# Patient Record
Sex: Female | Born: 1990 | Race: Black or African American | Hispanic: No | Marital: Single | Smoking: Never smoker
Health system: Southern US, Community
[De-identification: ages and names within clinical notes are randomized; demographics above are authoritative.]

## PROBLEM LIST (undated history)

## (undated) DIAGNOSIS — T7840XA Allergy, unspecified, initial encounter: Secondary | ICD-10-CM

## (undated) DIAGNOSIS — F419 Anxiety disorder, unspecified: Secondary | ICD-10-CM

## (undated) DIAGNOSIS — E611 Iron deficiency: Secondary | ICD-10-CM

## (undated) DIAGNOSIS — K219 Gastro-esophageal reflux disease without esophagitis: Secondary | ICD-10-CM

## (undated) DIAGNOSIS — D509 Iron deficiency anemia, unspecified: Secondary | ICD-10-CM

## (undated) DIAGNOSIS — F32A Depression, unspecified: Secondary | ICD-10-CM

## (undated) DIAGNOSIS — F329 Major depressive disorder, single episode, unspecified: Secondary | ICD-10-CM

## (undated) HISTORY — DX: Depression, unspecified: F32.A

## (undated) HISTORY — DX: Allergy, unspecified, initial encounter: T78.40XA

## (undated) HISTORY — DX: Iron deficiency: E61.1

## (undated) HISTORY — DX: Anxiety disorder, unspecified: F41.9

## (undated) HISTORY — DX: Gastro-esophageal reflux disease without esophagitis: K21.9

## (undated) HISTORY — PX: WISDOM TOOTH EXTRACTION: SHX21

---
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
## 2015-11-04 ENCOUNTER — Emergency Department (HOSPITAL_COMMUNITY)
Admission: EM | Admit: 2015-11-04 | Discharge: 2015-11-04 | Disposition: A | Discharge status: Home / Self Care | Payer: Self-pay | Attending: Emergency Medicine | Admitting: Emergency Medicine

## 2015-11-04 ENCOUNTER — Encounter (HOSPITAL_COMMUNITY): Payer: Self-pay | Admitting: Emergency Medicine

## 2015-11-04 DIAGNOSIS — M25531 Pain in right wrist: Secondary | ICD-10-CM | POA: Insufficient documentation

## 2015-11-04 DIAGNOSIS — Y999 Unspecified external cause status: Secondary | ICD-10-CM | POA: Insufficient documentation

## 2015-11-04 DIAGNOSIS — Y9389 Activity, other specified: Secondary | ICD-10-CM | POA: Insufficient documentation

## 2015-11-04 DIAGNOSIS — M25532 Pain in left wrist: Secondary | ICD-10-CM

## 2015-11-04 DIAGNOSIS — T23112A Burn of first degree of left thumb (nail), initial encounter: Secondary | ICD-10-CM | POA: Insufficient documentation

## 2015-11-04 DIAGNOSIS — Y92411 Interstate highway as the place of occurrence of the external cause: Secondary | ICD-10-CM | POA: Insufficient documentation

## 2015-11-04 HISTORY — DX: Iron deficiency anemia, unspecified: D50.9

## 2015-11-04 MED ORDER — IBUPROFEN 400 MG PO TABS: 400.0000 mg | ORAL_TABLET | Freq: Three times a day (TID) | ORAL | 0 refills | Status: DC | PRN
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
## 2016-07-23 ENCOUNTER — Encounter (HOSPITAL_COMMUNITY): Payer: Self-pay | Admitting: *Deleted

## 2016-07-23 DIAGNOSIS — K529 Noninfective gastroenteritis and colitis, unspecified: Secondary | ICD-10-CM | POA: Insufficient documentation

## 2016-07-23 DIAGNOSIS — Z79899 Other long term (current) drug therapy: Secondary | ICD-10-CM | POA: Insufficient documentation

## 2016-07-23 LAB — POC URINE PREG, ED: Preg Test, Ur: NEGATIVE

## 2016-07-23 LAB — URINALYSIS, ROUTINE W REFLEX MICROSCOPIC
Bacteria, UA: NONE SEEN
Bilirubin Urine: NEGATIVE
GLUCOSE, UA: NEGATIVE mg/dL
KETONES UR: NEGATIVE mg/dL
Nitrite: NEGATIVE
Protein, ur: 30 mg/dL — AB
Specific Gravity, Urine: 1.025 (ref 1.005–1.030)
pH: 5 (ref 5.0–8.0)

## 2016-07-23 NOTE — ED Triage Notes (Signed)
Pt complains of generalized abdominal pain and back pain since 9AM today. Pt states the pain feels like she's having hunger pain but has been eating all day. Pt states she felt nauseas and heartburn but has not had emesis today. Pt

## 2016-07-24 ENCOUNTER — Encounter (HOSPITAL_COMMUNITY): Payer: Self-pay | Admitting: Radiology

## 2016-07-24 ENCOUNTER — Emergency Department (HOSPITAL_COMMUNITY)
Admission: EM | Admit: 2016-07-24 | Discharge: 2016-07-24 | Disposition: A | Discharge status: Home / Self Care | Payer: Self-pay | Attending: Emergency Medicine | Admitting: Emergency Medicine

## 2016-07-24 ENCOUNTER — Emergency Department (HOSPITAL_COMMUNITY): Payer: Self-pay

## 2016-07-24 DIAGNOSIS — K529 Noninfective gastroenteritis and colitis, unspecified: Secondary | ICD-10-CM

## 2016-07-24 LAB — COMPREHENSIVE METABOLIC PANEL
ALK PHOS: 83 U/L (ref 38–126)
ALT: 9 U/L — ABNORMAL LOW (ref 14–54)
ANION GAP: 10 (ref 5–15)
AST: 19 U/L (ref 15–41)
Albumin: 4.2 g/dL (ref 3.5–5.0)
BUN: 8 mg/dL (ref 6–20)
CALCIUM: 9 mg/dL (ref 8.9–10.3)
CO2: 21 mmol/L — AB (ref 22–32)
Chloride: 105 mmol/L (ref 101–111)
Creatinine, Ser: 0.74 mg/dL (ref 0.44–1.00)
GFR calc non Af Amer: >60 mL/min (ref >60)
Glucose, Bld: 83 mg/dL (ref 65–99)
Potassium: 3.8 mmol/L (ref 3.5–5.1)
SODIUM: 136 mmol/L (ref 135–145)
Total Bilirubin: 0.4 mg/dL (ref 0.3–1.2)
Total Protein: 7.4 g/dL (ref 6.5–8.1)

## 2016-07-24 LAB — CBC
HCT: 36.5 % (ref 36.0–46.0)
HEMOGLOBIN: 11.7 g/dL — AB (ref 12.0–15.0)
MCH: 28.4 pg (ref 26.0–34.0)
MCHC: 32.1 g/dL (ref 30.0–36.0)
MCV: 88.6 fL (ref 78.0–100.0)
Platelets: 282 10*3/uL (ref 150–400)
RBC: 4.12 MIL/uL (ref 3.87–5.11)
RDW: 12.3 % (ref 11.5–15.5)
WBC: 8 10*3/uL (ref 4.0–10.5)

## 2016-07-24 LAB — LIPASE, BLOOD: Lipase: 25 U/L (ref 11–51)

## 2016-07-24 MED ORDER — IOPAMIDOL (ISOVUE-300) INJECTION 61%
INTRAVENOUS | Status: AC
  Filled 2016-07-24: qty 100

## 2016-07-24 MED ORDER — DICYCLOMINE HCL 10 MG PO CAPS
10.0000 mg | ORAL_CAPSULE | Freq: Once | ORAL | Status: AC
  Administered 2016-07-24: 10 mg via ORAL
  Filled 2016-07-24: qty 1

## 2016-07-24 MED ORDER — DICYCLOMINE HCL 20 MG PO TABS: 20.0000 mg | ORAL_TABLET | Freq: Two times a day (BID) | ORAL | 0 refills | Status: DC

## 2016-07-24 MED ORDER — IOPAMIDOL (ISOVUE-300) INJECTION 61%
100.0000 mL | Freq: Once | INTRAVENOUS | Status: AC | PRN
  Administered 2016-07-24: 100 mL via INTRAVENOUS

## 2016-07-24 MED ORDER — ONDANSETRON 8 MG PO TBDP: 8.0000 mg | ORAL_TABLET | Freq: Three times a day (TID) | ORAL | 0 refills | Status: DC | PRN

## 2016-07-24 MED ORDER — GI COCKTAIL ~~LOC~~
30.0000 mL | Freq: Once | ORAL | Status: AC
  Administered 2016-07-24: 30 mL via ORAL
  Filled 2016-07-24: qty 30

## 2016-07-24 NOTE — ED Provider Notes (Signed)
[facility] DEPT Provider Note   CSN: [id number] Arrival date & time: 07/23/16  2226  By signing my name below, I, Diona Browner, attest that this documentation has been prepared under the direction and in the presence of Lehman Whiteley, PA-C. Electronically Signed: Diona Browner, ED Scribe. 07/24/16. 12:34 AM.  History   Chief Complaint Chief Complaint  Patient presents with  . Abdominal Pain    HPI Victoria Lester is a 26 y.o. female who presents to the Emergency Department complaining of gradually worsening abdominal pain that started ~ 9 am on 07/23/16. Associated sx include right back pain, nausea, CP, HA and heartburn. She reports it feels like she is hunger but she has been eating all day. She reports taking migraine medication for her HA. She also tried sleeping to alleviate her pain, with no relief. No hx of abdominal surgery. Currently on her menstrual period. Pt denies vomiting, fever, dysuria, frequency, urgency.    The history is provided by the patient. No language interpreter was used.    Past Medical History:  Diagnosis Date  . Iron deficiency anemia     There are no active problems to display for this patient.   Past Surgical History:  Procedure Laterality Date  . WISDOM TOOTH EXTRACTION      OB History    No data available       Home Medications    Prior to Admission medications   Medication Sig Start Date End Date Taking? Authorizing Provider  ibuprofen (ADVIL,MOTRIN) 400 MG tablet Take 1 tablet (400 mg total) by mouth every 8 (eight) hours as needed. 11/04/15   Azalia Bilis, MD    Family History No family history on file.  Social History Social History  Substance Use Topics  . Smoking status: Never Smoker  . Smokeless tobacco: Never Used  . Alcohol use Yes     Allergies   Patient has no known allergies.   Review of Systems Review of Systems  Constitutional: Negative for fever.  Cardiovascular: Positive for chest pain.    Gastrointestinal: Positive for abdominal pain and nausea. Negative for vomiting.  Genitourinary: Negative for dysuria, frequency and urgency.  Musculoskeletal: Positive for back pain.  Neurological: Positive for headaches.  All other systems reviewed and are negative.    Physical Exam Updated Vital Signs BP (!) 153/112 (BP Location: Left Arm)   Pulse 81   Temp 98 F (36.7 C) (Oral)   Resp 19   LMP 07/23/2016   SpO2 100%   Physical Exam  Constitutional: She appears well-developed and well-nourished. No distress.  HENT:  Head: Normocephalic and atraumatic.  Eyes: Conjunctivae are normal.  Neck: Normal range of motion. Neck supple.  Cardiovascular: Normal rate, regular rhythm and normal heart sounds.   Pulmonary/Chest: Effort normal and breath sounds normal. No respiratory distress. She has no wheezes. She has no rales.  Abdominal: Soft. Bowel sounds are normal. She exhibits no distension. There is no tenderness. There is no rebound.  Mild periumbilical and epigastric tenderness. No guarding  Musculoskeletal: Normal range of motion. She exhibits no edema.  Neurological: She is alert.  Skin: Skin is warm and dry. No pallor.  Psychiatric: She has a normal mood and affect. Her behavior is normal.  Nursing note and vitals reviewed.    ED Treatments / Results  DIAGNOSTIC STUDIES: Oxygen Saturation is 98% on RA, normal by my interpretation.   COORDINATION OF CARE: 12:34 AM-Discussed next steps with pt. Pt verbalized understanding and is agreeable with the plan.  Labs (all labs ordered are listed, but only abnormal results are displayed) Labs Reviewed  COMPREHENSIVE METABOLIC PANEL - Abnormal; Notable for the following:       Result Value   CO2 21 (*)    ALT 9 (*)    All other components within normal limits  CBC - Abnormal; Notable for the following:    Hemoglobin 11.7 (*)    All other components within normal limits  URINALYSIS, ROUTINE W REFLEX MICROSCOPIC -  Abnormal; Notable for the following:    APPearance HAZY (*)    Hgb urine dipstick LARGE (*)    Protein, ur 30 (*)    Leukocytes, UA TRACE (*)    Squamous Epithelial / LPF 0-5 (*)    All other components within normal limits  LIPASE, BLOOD  POC URINE PREG, ED    EKG  EKG Interpretation None       Radiology Ct Abdomen Pelvis W Contrast  Result Date: 07/24/2016 CLINICAL DATA:  Generalized abdominal back pain since 9 a.m. with nausea EXAM: CT ABDOMEN AND PELVIS WITH CONTRAST TECHNIQUE: Multidetector CT imaging of the abdomen and pelvis was performed using the standard protocol following bolus administration of intravenous contrast. CONTRAST:  100mL ISOVUE-300 IOPAMIDOL (ISOVUE-300) INJECTION 61% COMPARISON:  None. FINDINGS: Lower chest: No acute abnormality. Hepatobiliary: Well-circumscribed 9 mm hypodensity in the periphery of the right hepatic lobe may represent a cyst or hemangioma but is too small to further characterize. No biliary dilatation is identified. The gallbladder is contracted without stones. Pancreas: Normal without ductal dilatation, mass or inflammation Spleen: Normal sized without focal mass Adrenals/Urinary Tract: Adrenal glands are unremarkable. Kidneys are normal, without renal calculi, focal lesion, or hydronephrosis. Bladder is unremarkable. Stomach/Bowel: Contracted stomach. Normal small bowel rotation. There is mild fluid-filled distention with mucosal enhancement of jejunal and ileal loops consistent with an enteritis. Large bowel is unremarkable. The appendix is normal. Vascular/Lymphatic: Normal without adenopathy. Reproductive: Intrauterine device is noted in the lower uterine segment with the uterus tilted to the right. No perforation of the IUD is seen. Enhancing left ovarian cyst may represent the corpus luteum. Other: No pneumoperitoneum nor free fluid. Musculoskeletal: No acute or significant osseous findings. IMPRESSION: 1. Diffuse mild fluid-filled small bowel  distention with mucosal enhancement consistent with a small bowel enteritis. No mechanical obstruction identified. 2. An intrauterine device is seen in the lower uterine segment. If the patient has pain in the lower pelvis, consider malpositioned IUD as a potential source for pain. No perforation however is noted. There is also an adjacent enhancing left ovarian cyst likely the corpus luteum or potentially a hemorrhagic cyst. Electronically Signed   By: Tollie Ethavid  Kwon M.D.   On: 07/24/2016 03:04    Procedures Procedures (including critical care time)  Medications Ordered in ED Medications  gi cocktail (Maalox,Lidocaine,Donnatal) (not administered)     Initial Impression / Assessment and Plan / ED Course  I have reviewed the triage vital signs and the nursing notes.  Pertinent labs & imaging results that were available during my care of the patient were reviewed by me and considered in my medical decision making (see chart for details).    Patient in emergency department with pain umbilical abdominal pain that radiates to the right flank. No associated vomiting. She has had some nausea today. No pain in the right upper quadrant on exam. Mild epigastric tenderness. No peritoneal signs. Question gastritis versus acid reflux. We'll get labs. Will try GI cocktail.  1:57 AM Center assessed, states that  she had no relief of pain with GI cocktail, patient is tearful. States pain is 10 out of 10. Discussed further evaluation with CT scan. Patient also stated that she has had kidney stones in the past but this does not feel like prior kidney stone. We will do CT with contrast.  3:17 AM Patient CT scan showing enteritis. It also showed possible malpositioned IUD and possible adnexal cyst, however patient is not having much pain in lower abdomen most of her pain is around her umbilicus and upper abdomen. I will treat her with Bentyl for possible intestinal spasms. Advised brat diet, fluids. Will give  prescription for Zofran for nausea. Will have patient follow-up with family doctor but improving in 1-2 days. Return precautions discussed. Patient is tolerating oral fluids, she is in no acute distress at this time, vital signs are normal, she is stable for discharge.  Vitals:   07/23/16 2237 07/24/16 0030 07/24/16 0121 07/24/16 0241  BP: (!) 147/98 (!) 153/112  125/89  Pulse: 100 81  69  Resp: 18 19  13   Temp: 98 F (36.7 C)     TempSrc: Oral     SpO2: 98% 100% 100% 100%     Final Clinical Impressions(s) / ED Diagnoses   Final diagnoses:  Enteritis    New Prescriptions New Prescriptions   DICYCLOMINE (BENTYL) 20 MG TABLET    Take 1 tablet (20 mg total) by mouth 2 (two) times daily.   ONDANSETRON (ZOFRAN ODT) 8 MG DISINTEGRATING TABLET    Take 1 tablet (8 mg total) by mouth every 8 (eight) hours as needed for nausea or vomiting.   I personally performed the services described in this documentation, which was scribed in my presence. The recorded information has been reviewed and is accurate.     Jaynie Crumble, PA-C 07/24/16 Lajoyce Corners, MD 07/24/16 [phone]

## 2016-07-24 NOTE — Discharge Instructions (Signed)
Drink plenty of fluids. Take Zofran as prescribed as needed for nausea. Take Bentyl for intestinal spasms. Follow-up with family doctor if not improving in 1-2 days. Return if worsening symptoms.

## 2018-01-10 IMAGING — CT CT ABD-PELV W/ CM
2 of 4 series · 15 of 46 positions shown, 17 images · IV contrast (iopamidol)
Comparison: None.

CLINICAL DATA: Generalized abdominal back pain since 9 a.m. with
nausea

EXAM:
CT ABDOMEN AND PELVIS WITH CONTRAST
TECHNIQUE: Multidetector CT imaging of the abdomen and pelvis was performed
using the standard protocol following bolus administration of
intravenous contrast.
CONTRAST:  100mL [id number] IOPAMIDOL ([id number]) INJECTION 61%

[Series 2: abd/pel with · axial · 0.70mm/px · z∈[-467,-97]mm · 12 of 84 slices shown, 14 images]
[im 5/84  soft-tissue]
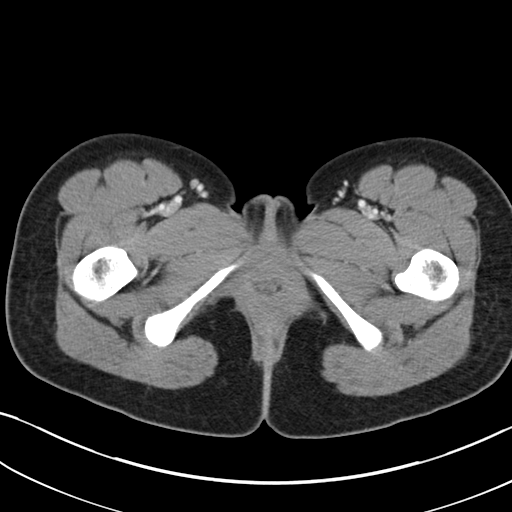
[im 5/84  bone]
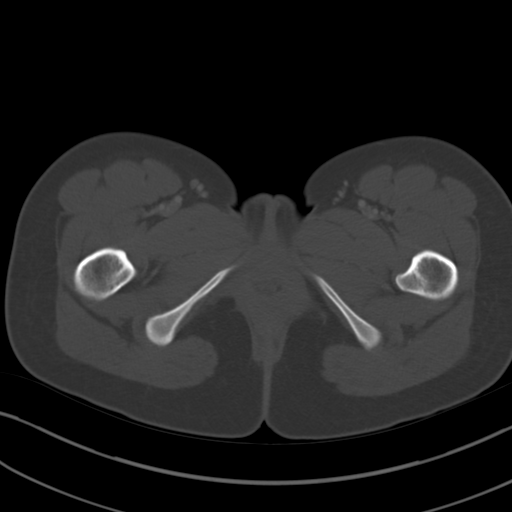
[im 14/84  soft-tissue]
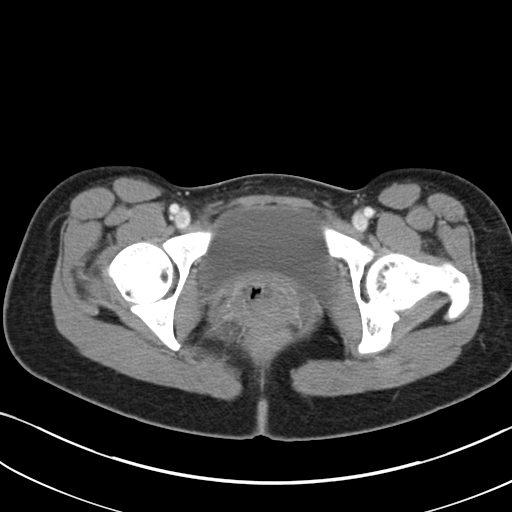
[im 18/84  soft-tissue]
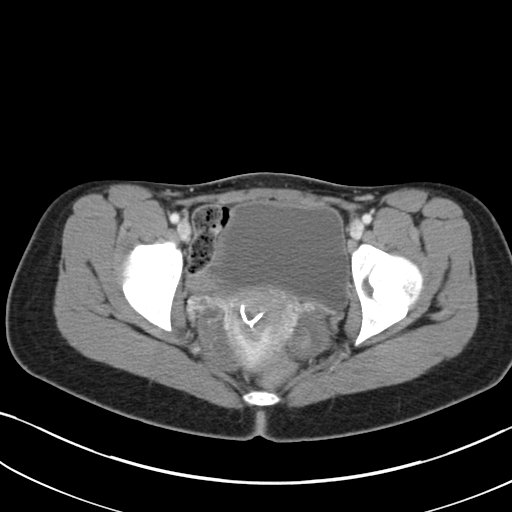
[im 27/84  soft-tissue]
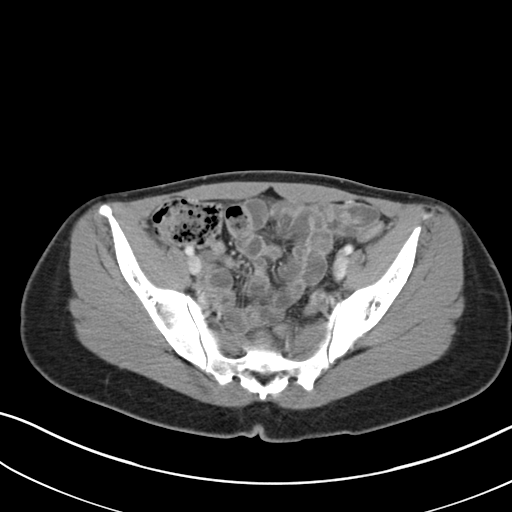
[im 31/84  soft-tissue]
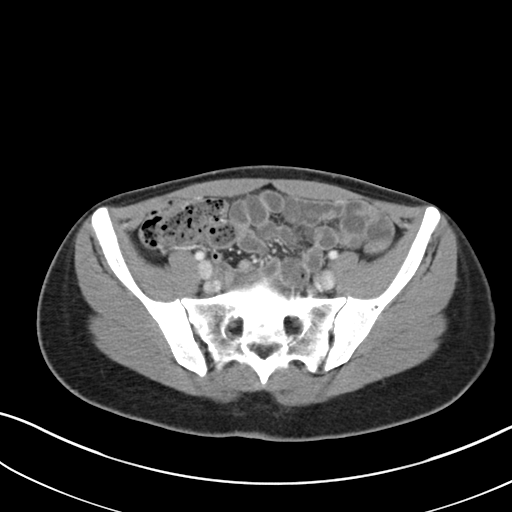
[im 40/84  soft-tissue]
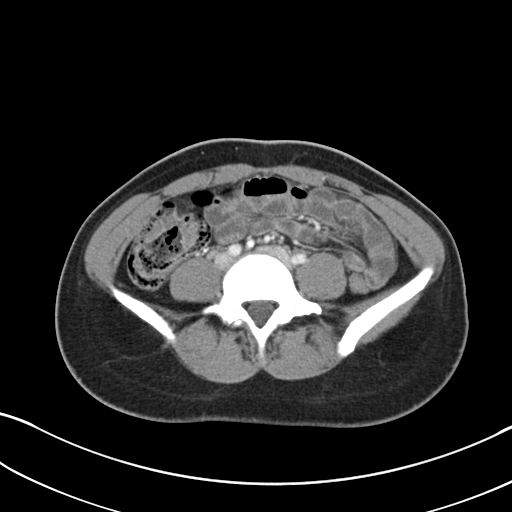
[im 44/84  soft-tissue]
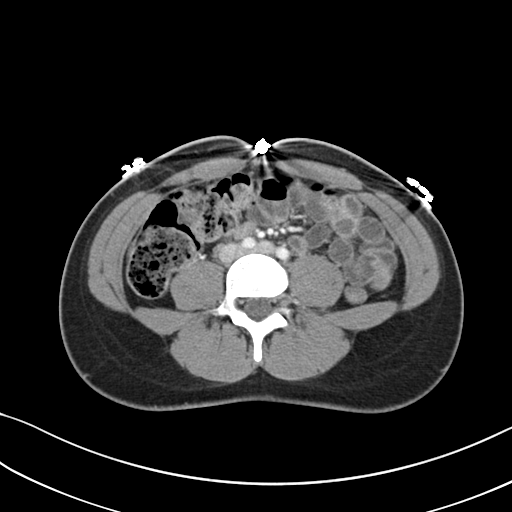
[im 53/84  soft-tissue]
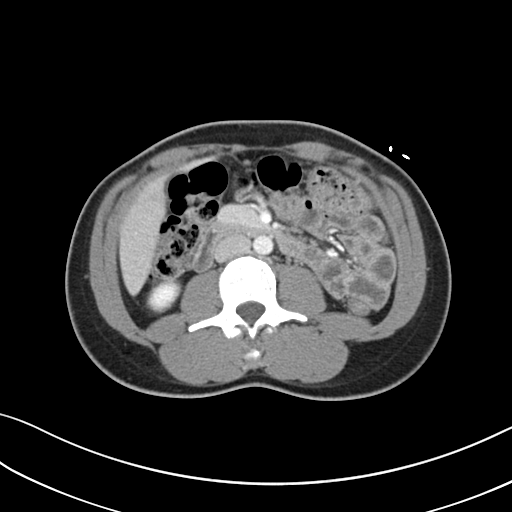
[im 57/84  soft-tissue]
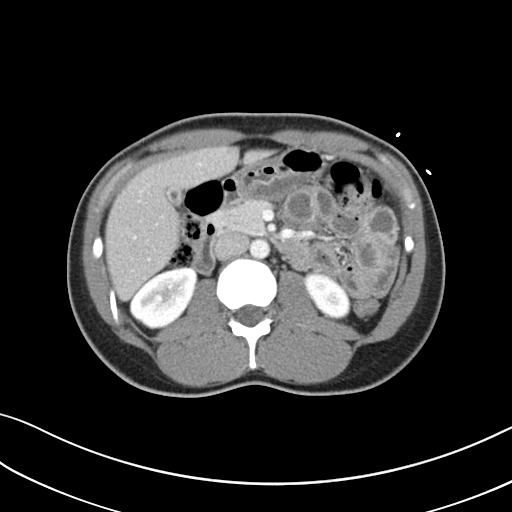
[im 57/84  bone]
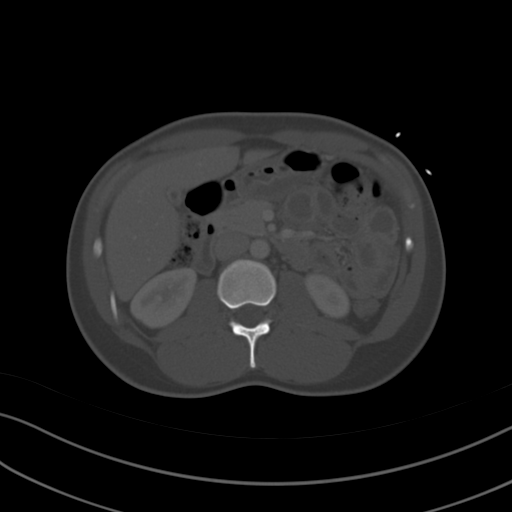
[im 66/84  soft-tissue]
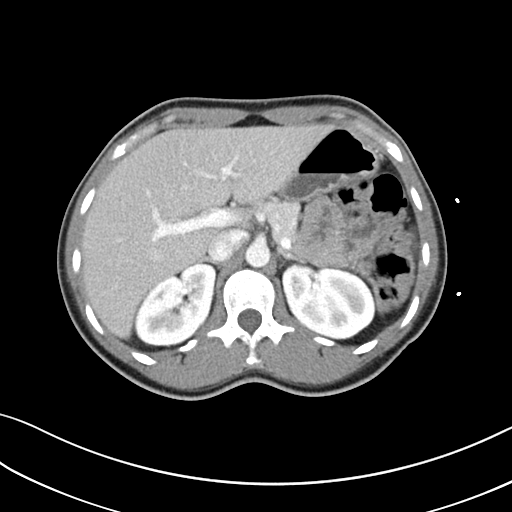
[im 70/84  soft-tissue]
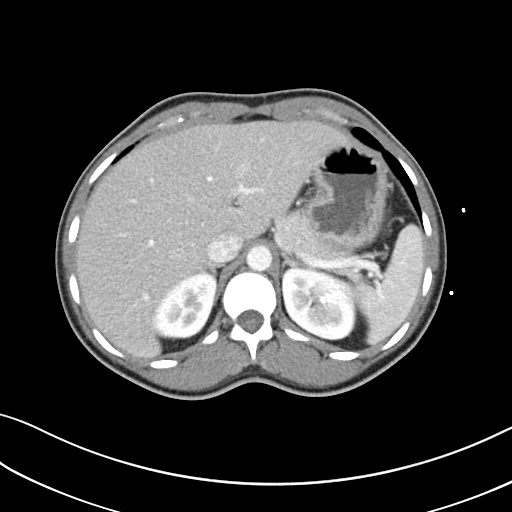
[im 79/84  soft-tissue]
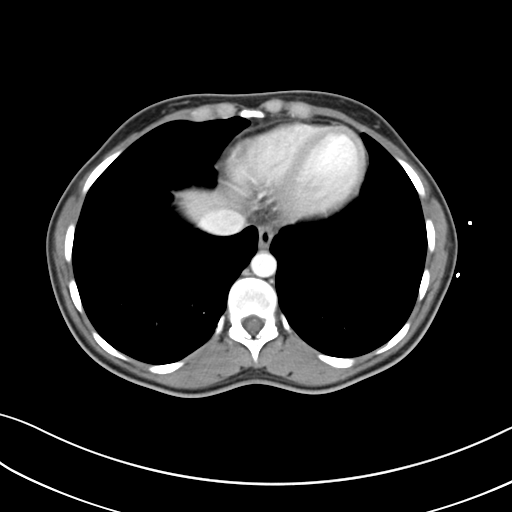

[Series 5: coronal a/|p · coronal · 0.63mm/px · 3 of 122 slices shown]
[im 41/122  soft-tissue]
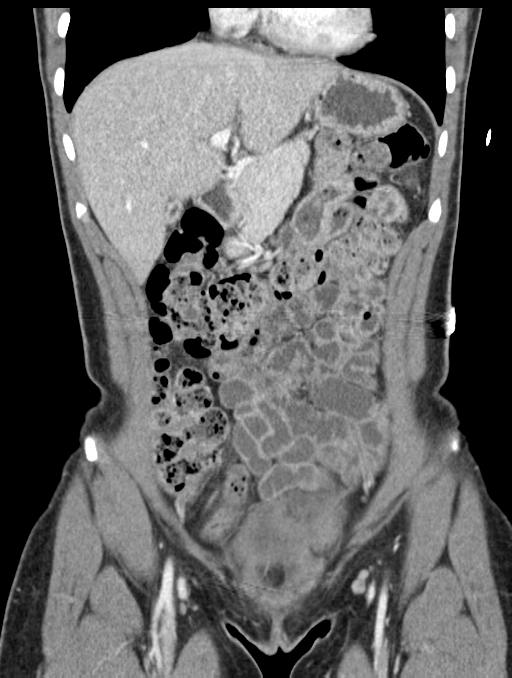
[im 54/122  soft-tissue]
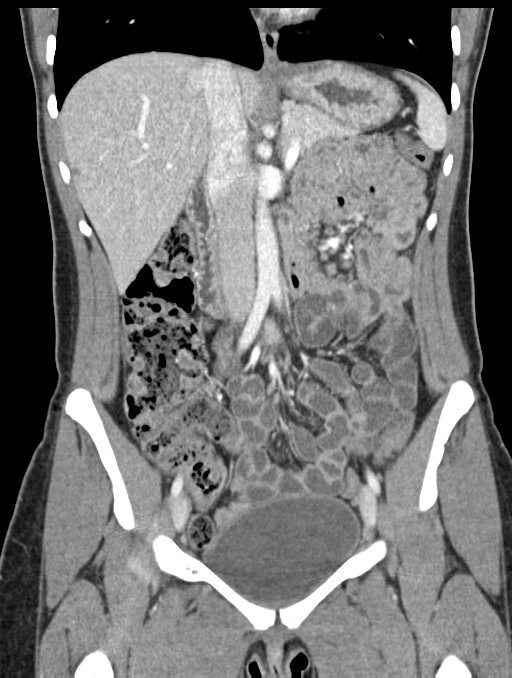
[im 68/122  soft-tissue]
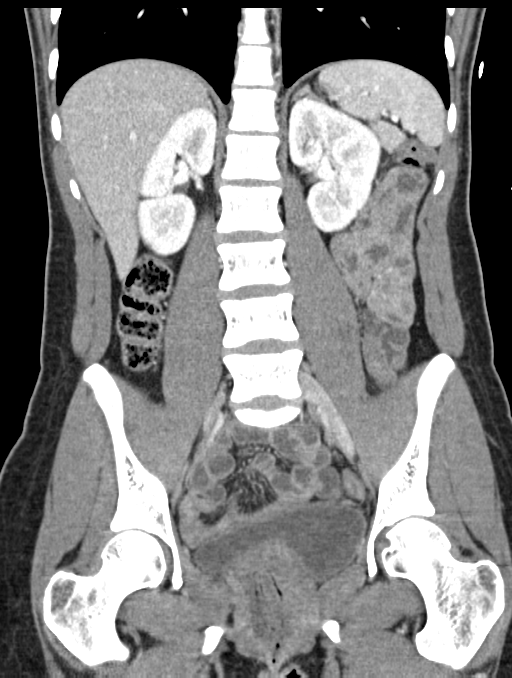

[15 of 46 positions shown; findings below may reference images not displayed]

FINDINGS: Lower chest: No acute abnormality.

Hepatobiliary: Well-circumscribed 9 mm hypodensity in the periphery
of the right hepatic lobe may represent a cyst or hemangioma but is
too small to further characterize. No biliary dilatation is
identified. The gallbladder is contracted without stones.

Pancreas: Normal without ductal dilatation, mass or inflammation

Spleen: Normal sized without focal mass

Adrenals/Urinary Tract: Adrenal glands are unremarkable. Kidneys are
normal, without renal calculi, focal lesion, or hydronephrosis.
Bladder is unremarkable.

Stomach/Bowel: Contracted stomach. Normal small bowel rotation.
There is mild fluid-filled distention with mucosal enhancement of
jejunal and ileal loops consistent with an enteritis. Large bowel is
unremarkable. The appendix is normal.

Vascular/Lymphatic: Normal without adenopathy.

Reproductive: Intrauterine device is noted in the lower uterine
segment with the uterus tilted to the right. No perforation of the
IUD is seen. Enhancing left ovarian cyst may represent the corpus
luteum.

Other: No pneumoperitoneum nor free fluid.

Musculoskeletal: No acute or significant osseous findings.
IMPRESSION: 1. Diffuse mild fluid-filled small bowel distention with mucosal
enhancement consistent with a small bowel enteritis. No mechanical
obstruction identified.
2. An intrauterine device is seen in the lower uterine segment. If
the patient has pain in the lower pelvis, consider malpositioned IUD
as a potential source for pain. No perforation however is noted.
There is also an adjacent enhancing left ovarian cyst likely the
corpus luteum or potentially a hemorrhagic cyst.

## 2019-04-10 ENCOUNTER — Other Ambulatory Visit: Payer: Self-pay

## 2019-04-10 ENCOUNTER — Telehealth (INDEPENDENT_AMBULATORY_CARE_PROVIDER_SITE_OTHER): Payer: Medicaid Other | Admitting: Family Medicine

## 2019-04-10 DIAGNOSIS — K30 Functional dyspepsia: Secondary | ICD-10-CM

## 2019-04-10 DIAGNOSIS — R0982 Postnasal drip: Secondary | ICD-10-CM

## 2019-04-10 MED ORDER — OMEPRAZOLE 20 MG PO CPDR: 20.0000 mg | DELAYED_RELEASE_CAPSULE | Freq: Every day | ORAL | 3 refills | Status: DC

## 2019-04-10 MED ORDER — FLUTICASONE PROPIONATE 50 MCG/ACT NA SUSP: 2.0000 | Freq: Every day | NASAL | 6 refills | Status: AC

## 2019-04-10 NOTE — Patient Instructions (Signed)
° ° ° °  If you have lab work done today you will be contacted with your lab results within the next 2 weeks.  If you have not heard from us then please contact us. The fastest way to get your results is to register for My Chart. ° ° °IF you received an x-ray today, you will receive an invoice from [facility]. Please contact [facility] at [phone] with questions or concerns regarding your invoice.  ° °IF you received labwork today, you will receive an invoice from [facility]. Please contact [facility] at [phone] with questions or concerns regarding your invoice.  ° °Our billing staff will not be able to assist you with questions regarding bills from these companies. ° °You will be contacted with the lab results as soon as they are available. The fastest way to get your results is to activate your My Chart account. Instructions are located on the last page of this paperwork. If you have not heard from us regarding the results in 2 weeks, please contact this office. °  ° ° ° °

## 2019-04-10 NOTE — Progress Notes (Signed)
Video Encounter- SOAP NOTE Established Patient  This video encounter was conducted with the patient's (or proxy's) verbal consent via audio telecommunications: yes/no: Yes Patient was instructed to have this encounter in a suitably private space; and to only have persons present to whom they give permission to participate. In addition, patient identity was confirmed by use of name plus two identifiers (DOB and address).  I discussed the limitations, risks, security and privacy concerns of performing an evaluation and management service by telephone and the availability of in person appointments. I also discussed with the patient that there may be a patient responsible charge related to this service. The patient expressed understanding and agreed to proceed.  I spent a total of TIME; 0 MIN TO 60 MIN: 15 minutes talking with the patient or their proxy.  Chief Complaint  Patient presents with  . pressure in ears,st    CC: pt c/o burning sensation in chest, pressure and aching in ears x 5 days.  Pt denies sob, loss of sense of taste and smell, fever, nausea/vomiting and abdominal pain or diarrhea.  No recent weight or bp taken.  No travel outside of [place] or [place] in the last 3 weeks.    Subjective   Victoria Lester is a 29 y.o. established patient. Telephone visit today for  HPI  Patient reports a week ago she had burning in her chest and thought it was heartburn It goes through her chest  She states that when she inhales it hurts even more She states that she feels like  She took tums and it help She burped a little bit She states that she stopped smoking 3 days ago No vaping or hookah She smokes marijuana   She is taking sudafed and zyrtec She states that she gets drainage postnasal No cough  No fevers or chills   There are no problems to display for this patient.   Past Medical History:  Diagnosis Date  . Iron deficiency anemia     Current Outpatient Medications  Medication  Sig Dispense Refill  . ibuprofen (ADVIL,MOTRIN) 400 MG tablet Take 1 tablet (400 mg total) by mouth every 8 (eight) hours as needed. 15 tablet 0  . levonorgestrel (MIRENA) 20 MCG/24HR IUD 1 each by Intrauterine route once.    Marland Kitchen aspirin-acetaminophen-caffeine (EXCEDRIN MIGRAINE) 250-250-65 MG tablet Take 2 tablets by mouth every 6 (six) hours as needed for headache or migraine.    . dicyclomine (BENTYL) 20 MG tablet Take 1 tablet (20 mg total) by mouth 2 (two) times daily. (Patient not taking: Reported on 04/10/2019) 20 tablet 0  . fluticasone (FLONASE) 50 MCG/ACT nasal spray Place 2 sprays into both nostrils daily. 16 g 6  . loratadine (CLARITIN) 10 MG tablet Take 10 mg by mouth daily.    Marland Kitchen omeprazole (PRILOSEC) 20 MG capsule Take 1 capsule (20 mg total) by mouth daily. 30 capsule 3  . ondansetron (ZOFRAN ODT) 8 MG disintegrating tablet Take 1 tablet (8 mg total) by mouth every 8 (eight) hours as needed for nausea or vomiting. (Patient not taking: Reported on 04/10/2019) 10 tablet 0   No current facility-administered medications for this visit.    No Known Allergies  Social History   Socioeconomic History  . Marital status: Single    Spouse name: Not on file  . Number of children: Not on file  . Years of education: Not on file  . Highest education level: Not on file  Occupational History  . Not on  file  Tobacco Use  . Smoking status: Never Smoker  . Smokeless tobacco: Never Used  Substance and Sexual Activity  . Alcohol use: Yes  . Drug use: Not on file  . Sexual activity: Not on file  Other Topics Concern  . Not on file  Social History Narrative  . Not on file   Social Determinants of Health   Financial Resource Strain:   . Difficulty of Paying Living Expenses: Not on file  Food Insecurity:   . Worried About Programme researcher, broadcasting/film/video in the Last Year: Not on file  . Ran Out of Food in the Last Year: Not on file  Transportation Needs:   . Lack of Transportation (Medical): Not on  file  . Lack of Transportation (Non-Medical): Not on file  Physical Activity:   . Days of Exercise per Week: Not on file  . Minutes of Exercise per Session: Not on file  Stress:   . Feeling of Stress : Not on file  Social Connections:   . Frequency of Communication with Friends and Family: Not on file  . Frequency of Social Gatherings with Friends and Family: Not on file  . Attends Religious Services: Not on file  . Active Member of Clubs or Organizations: Not on file  . Attends Banker Meetings: Not on file  . Marital Status: Not on file  Intimate Partner Violence:   . Fear of Current or Ex-Partner: Not on file  . Emotionally Abused: Not on file  . Physically Abused: Not on file  . Sexually Abused: Not on file    ROS Review of Systems  Constitutional: Negative for activity change, appetite change, chills and fever.  HENT: Negative for congestion, nosebleeds, trouble swallowing and voice change.   Respiratory: Negative for cough, shortness of breath and wheezing.   Gastrointestinal: Negative for diarrhea, nausea and vomiting.  Genitourinary: Negative for difficulty urinating, dysuria, flank pain and hematuria.  Musculoskeletal: Negative for back pain, joint swelling and neck pain.  Neurological: Negative for dizziness, speech difficulty, light-headedness and numbness.  See HPI. All other review of systems negative.   Objective   Vitals as reported by the patient:  Physical Exam  Constitutional: Oriented to person, place, and time. Appears well-developed and well-nourished.  HENT:  Head: Normocephalic and atraumatic.  Eyes: Conjunctivae and EOM are normal.  Pulmonary/Chest: Effort normal  Neurological: Is alert and oriented to person, place, and time.  Skin: Skin is warm. Capillary refill takes less than 2 seconds.  Psychiatric: Has a normal mood and affect. Behavior is normal. Judgment and thought content normal.   There were no vitals filed for this visit.   [name] was seen today for pressure in ears,st.  Diagnoses and all orders for this visit:  Burning sensation of stomach  Postnasal drip  Other orders -     omeprazole (PRILOSEC) 20 MG capsule; Take 1 capsule (20 mg total) by mouth daily. -     fluticasone (FLONASE) 50 MCG/ACT nasal spray; Place 2 sprays into both nostrils daily.  advised pt to take flonase for postnasal drip and xyzal   Advised PPI omeprazole for burning sensation and also smoking cessation  Follow up in 4 weeks for testing for H. Pylori if symptoms do not improve    I discussed the assessment and treatment plan with the patient. The patient was provided an opportunity to ask questions and all were answered. The patient agreed with the plan and demonstrated an understanding of the instructions.  The patient was advised to call back or seek an in-person evaluation if the symptoms worsen or if the condition fails to improve as anticipated.  I provided 15 minutes of non-face-to-face time during this encounter.  Forrest Moron, MD  Primary Care at [facility]

## 2019-04-10 NOTE — Progress Notes (Signed)
CC: pt c/o burning sensation in chest, pressure and aching in ears x 5 days.  Pt denies sob, loss of sense of taste and smell, fever, nausea/vomiting and abdominal pain or diarrhea.  No recent weight or bp taken.  No travel outside of [place] or [place] in the last 3 weeks.

## 2019-05-08 ENCOUNTER — Ambulatory Visit: Payer: Medicaid Other | Admitting: Family Medicine

## 2019-05-13 ENCOUNTER — Ambulatory Visit (INDEPENDENT_AMBULATORY_CARE_PROVIDER_SITE_OTHER): Payer: Self-pay | Admitting: Family Medicine

## 2019-05-13 ENCOUNTER — Encounter: Payer: Self-pay | Admitting: Family Medicine

## 2019-05-13 ENCOUNTER — Other Ambulatory Visit: Payer: Self-pay

## 2019-05-13 VITALS — BP 130/76 | HR 82 | Temp 97.8°F | Resp 14 | Ht 64.0 in | Wt 137.8 lb

## 2019-05-13 DIAGNOSIS — R0789 Other chest pain: Secondary | ICD-10-CM

## 2019-05-13 DIAGNOSIS — K30 Functional dyspepsia: Secondary | ICD-10-CM

## 2019-05-13 NOTE — Progress Notes (Signed)
Established Patient Office Visit  Subjective:  Patient ID: Victoria Lester, female    DOB: 04-28-1990  Age: 29 y.o. MRN: [id number]  CC:  Chief Complaint  Patient presents with  . Gastroesophageal Reflux    pt states its tollreable but the medication does not stop it completly states it feels like a burning spreadnig across her chest     HPI Victoria Lester presents for follow up on burning chest pain  She states that she was having this burning chest discomfort On the visit on 04/10/19 she was prescribed omeprazole which she takes daily and also she quit smoking. She quit smoking She sometimes only eats one meal a day She does not drink energy drinks or caffeine She drinks wine on the weekends  Past Medical History:  Diagnosis Date  . Iron deficiency anemia     Past Surgical History:  Procedure Laterality Date  . WISDOM TOOTH EXTRACTION      No family history on file.  Social History   Socioeconomic History  . Marital status: Single    Spouse name: Not on file  . Number of children: Not on file  . Years of education: Not on file  . Highest education level: Not on file  Occupational History  . Not on file  Tobacco Use  . Smoking status: Never Smoker  . Smokeless tobacco: Never Used  Substance and Sexual Activity  . Alcohol use: Yes  . Drug use: Not on file  . Sexual activity: Not on file  Other Topics Concern  . Not on file  Social History Narrative  . Not on file   Social Determinants of Health   Financial Resource Strain:   . Difficulty of Paying Living Expenses: Not on file  Food Insecurity:   . Worried About Charity fundraiser in the Last Year: Not on file  . Ran Out of Food in the Last Year: Not on file  Transportation Needs:   . Lack of Transportation (Medical): Not on file  . Lack of Transportation (Non-Medical): Not on file  Physical Activity:   . Days of Exercise per Week: Not on file  . Minutes of Exercise per Session: Not on file  Stress:     . Feeling of Stress : Not on file  Social Connections:   . Frequency of Communication with Friends and Family: Not on file  . Frequency of Social Gatherings with Friends and Family: Not on file  . Attends Religious Services: Not on file  . Active Member of Clubs or Organizations: Not on file  . Attends Archivist Meetings: Not on file  . Marital Status: Not on file  Intimate Partner Violence:   . Fear of Current or Ex-Partner: Not on file  . Emotionally Abused: Not on file  . Physically Abused: Not on file  . Sexually Abused: Not on file    Outpatient Medications Prior to Visit  Medication Sig Dispense Refill  . aspirin-acetaminophen-caffeine (EXCEDRIN MIGRAINE) 250-250-65 MG tablet Take 2 tablets by mouth every 6 (six) hours as needed for headache or migraine.    . dicyclomine (BENTYL) 20 MG tablet Take 1 tablet (20 mg total) by mouth 2 (two) times daily. 20 tablet 0  . fluticasone (FLONASE) 50 MCG/ACT nasal spray Place 2 sprays into both nostrils daily. 16 g 6  . ibuprofen (ADVIL,MOTRIN) 400 MG tablet Take 1 tablet (400 mg total) by mouth every 8 (eight) hours as needed. 15 tablet 0  . levonorgestrel (MIRENA) 20  MCG/24HR IUD 1 each by Intrauterine route once.    Marland Kitchen omeprazole (PRILOSEC) 20 MG capsule Take 1 capsule (20 mg total) by mouth daily. 30 capsule 3  . ondansetron (ZOFRAN ODT) 8 MG disintegrating tablet Take 1 tablet (8 mg total) by mouth every 8 (eight) hours as needed for nausea or vomiting. 10 tablet 0  . sertraline (ZOLOFT) 50 MG tablet Take 50 mg by mouth daily.    Marland Kitchen loratadine (CLARITIN) 10 MG tablet Take 10 mg by mouth daily.     No facility-administered medications prior to visit.    No Known Allergies  ROS Review of Systems Review of Systems  Constitutional: Negative for activity change, appetite change, chills and fever.  HENT: Negative for congestion, nosebleeds, trouble swallowing and voice change.   Respiratory: Negative for cough, shortness of  breath and wheezing.   Gastrointestinal: Negative for diarrhea, nausea and vomiting.  Genitourinary: Negative for difficulty urinating, dysuria, flank pain and hematuria.  Musculoskeletal: Negative for back pain, joint swelling and neck pain.  Neurological: Negative for dizziness, speech difficulty, light-headedness and numbness.  See HPI. All other review of systems negative.     Objective:    Physical Exam  BP 130/76   Pulse 82   Temp 97.8 F (36.6 C) (Temporal)   Resp 14   Ht 5\' 4"  (1.626 m)   Wt 137 lb 12.8 oz (62.5 kg)   SpO2 94%   BMI 23.65 kg/m  Wt Readings from Last 3 Encounters:  05/13/19 137 lb 12.8 oz (62.5 kg)  11/04/15 140 lb (63.5 kg)   Physical Exam  Constitutional: Oriented to person, place, and time. Appears well-developed and well-nourished.  HENT:  Head: Normocephalic and atraumatic.  Eyes: Conjunctivae and EOM are normal.  Cardiovascular: Normal rate, regular rhythm, normal heart sounds and intact distal pulses.  No murmur heard. Pulmonary/Chest: Effort normal and breath sounds normal. No stridor. No respiratory distress. Has no wheezes.  Abdomen: nondistended, normoactive bs, soft, nontender Neurological: Is alert and oriented to person, place, and time.  Skin: Skin is warm. Capillary refill takes less than 2 seconds.  Psychiatric: Has a normal mood and affect. Behavior is normal. Judgment and thought content normal.    Health Maintenance Due  Topic Date Due  . HIV Screening  02/07/2006    There are no preventive care reminders to display for this patient.  No results found for: TSH Lab Results  Component Value Date   WBC 8.0 07/23/2016   HGB 11.7 (L) 07/23/2016   HCT 36.5 07/23/2016   MCV 88.6 07/23/2016   PLT 282 07/23/2016   Lab Results  Component Value Date   NA 136 07/23/2016   K 3.8 07/23/2016   CO2 21 (L) 07/23/2016   GLUCOSE 83 07/23/2016   BUN 8 07/23/2016   CREATININE 0.74 07/23/2016   BILITOT 0.4 07/23/2016   ALKPHOS 83  07/23/2016   AST 19 07/23/2016   ALT 9 (L) 07/23/2016   PROT 7.4 07/23/2016   ALBUMIN 4.2 07/23/2016   CALCIUM 9.0 07/23/2016   ANIONGAP 10 07/23/2016   No results found for: CHOL No results found for: HDL No results found for: LDLCALC No results found for: TRIG No results found for: CHOLHDL No results found for: 07/25/2016    Assessment & Plan:   Problem List Items Addressed This Visit    None    Visit Diagnoses    Other chest pain    -  Primary   Relevant Orders   EKG  12-Lead (Completed)   Ambulatory referral to Gastroenterology   Burning sensation of stomach       Relevant Orders   Ambulatory referral to Gastroenterology    advised pt to eat small meals, chewing food to a pulp Advised to continue PPI Avoid food triggers See GI for evaluation Would like to start empiric carafate but because of her smoking history would prefer EGD first ECG normal sinus rhythm, no arrhythmia    No orders of the defined types were placed in this encounter.   Follow-up: No follow-ups on file.    Doristine Bosworth, MD

## 2019-05-13 NOTE — Patient Instructions (Addendum)
If you have lab work done today you will be contacted with your lab results within the next 2 weeks.  If you have not heard from [place] then please contact us. The fastest way to get your results is to register for My Chart.   IF you received an x-ray today, you will receive an invoice from [facility]. Please contact [facility] at [phone] with questions or concerns regarding your invoice.   IF you received labwork today, you will receive an invoice from [facility]. Please contact [facility] at [phone] with questions or concerns regarding your invoice.   Our billing staff will not be able to assist you with questions regarding bills from these companies.  You will be contacted with the lab results as soon as they are available. The fastest way to get your results is to activate your My Chart account. Instructions are located on the last page of this paperwork. If you have not heard from [place] regarding the results in 2 weeks, please contact this office.     Gastroesophageal Reflux Disease, Adult Gastroesophageal reflux (GER) happens when acid from the stomach flows up into the tube that connects the mouth and the stomach (esophagus). Normally, food travels down the esophagus and stays in the stomach to be digested. However, when a person has GER, food and stomach acid sometimes move back up into the esophagus. If this becomes a more serious problem, the person may be diagnosed with a disease called gastroesophageal reflux disease (GERD). GERD occurs when the reflux:  Happens often.  Causes frequent or severe symptoms.  Causes problems such as damage to the esophagus. When stomach acid comes in contact with the esophagus, the acid may cause soreness (inflammation) in the esophagus. Over time, GERD may create small holes (ulcers) in the lining of the esophagus. What are the causes? This condition is caused by a problem with the muscle between the esophagus and the  stomach (lower esophageal sphincter, or LES). Normally, the LES muscle closes after food passes through the esophagus to the stomach. When the LES is weakened or abnormal, it does not close properly, and that allows food and stomach acid to go back up into the esophagus. The LES can be weakened by certain dietary substances, medicines, and medical conditions, including:  Tobacco use.  Pregnancy.  Having a hiatal hernia.  Alcohol use.  Certain foods and beverages, such as coffee, chocolate, onions, and peppermint. What increases the risk? You are more likely to develop this condition if you:  Have an increased body weight.  Have a connective tissue disorder.  Use NSAID medicines. What are the signs or symptoms? Symptoms of this condition include:  Heartburn.  Difficult or painful swallowing.  The feeling of having a lump in the throat.  Abitter taste in the mouth.  Bad breath.  Having a large amount of saliva.  Having an upset or bloated stomach.  Belching.  Chest pain. Different conditions can cause chest pain. Make sure you see your health care provider if you experience chest pain.  Shortness of breath or wheezing.  Ongoing (chronic) cough or a night-time cough.  Wearing away of tooth enamel.  Weight loss. How is this diagnosed? Your health care provider will take a medical history and perform a physical exam. To determine if you have mild or severe GERD, your health care provider may also monitor how you respond to treatment. You may also have tests, including:  A test to examine your stomach and  esophagus with a small camera (endoscopy).  A test thatmeasures the acidity level in your esophagus.  A test thatmeasures how much pressure is on your esophagus.  A barium swallow or modified barium swallow test to show the shape, size, and functioning of your esophagus. How is this treated? The goal of treatment is to help relieve your symptoms and to prevent  complications. Treatment for this condition may vary depending on how severe your symptoms are. Your health care provider may recommend:  Changes to your diet.  Medicine.  Surgery. Follow these instructions at home: Eating and drinking   Follow a diet as recommended by your health care provider. This may involve avoiding foods and drinks such as: ? Coffee and tea (with or without caffeine). ? Drinks that containalcohol. ? Energy drinks and sports drinks. ? Carbonated drinks or sodas. ? Chocolate and cocoa. ? Peppermint and mint flavorings. ? Garlic and onions. ? Horseradish. ? Spicy and acidic foods, including peppers, chili powder, curry powder, vinegar, hot sauces, and barbecue sauce. ? Citrus fruit juices and citrus fruits, such as oranges, lemons, and limes. ? Tomato-based foods, such as red sauce, chili, salsa, and pizza with red sauce. ? Fried and fatty foods, such as donuts, french fries, potato chips, and high-fat dressings. ? High-fat meats, such as hot dogs and fatty cuts of red and white meats, such as rib eye steak, sausage, ham, and bacon. ? High-fat dairy items, such as whole milk, butter, and cream cheese.  Eat small, frequent meals instead of large meals.  Avoid drinking large amounts of liquid with your meals.  Avoid eating meals during the 2-3 hours before bedtime.  Avoid lying down right after you eat.  Do not exercise right after you eat. Lifestyle   Do not use any products that contain nicotine or tobacco, such as cigarettes, e-cigarettes, and chewing tobacco. If you need help quitting, ask your health care provider.  Try to reduce your stress by using methods such as yoga or meditation. If you need help reducing stress, ask your health care provider.  If you are overweight, reduce your weight to an amount that is healthy for you. Ask your health care provider for guidance about a safe weight loss goal. General instructions  Pay attention to any  changes in your symptoms.  Take over-the-counter and prescription medicines only as told by your health care provider. Do not take aspirin, ibuprofen, or other NSAIDs unless your health care provider told you to do so.  Wear loose-fitting clothing. Do not wear anything tight around your waist that causes pressure on your abdomen.  Raise (elevate) the head of your bed about 6 inches (15 cm).  Avoid bending over if this makes your symptoms worse.  Keep all follow-up visits as told by your health care provider. This is important. Contact a health care provider if:  You have: ? New symptoms. ? Unexplained weight loss. ? Difficulty swallowing or it hurts to swallow. ? Wheezing or a persistent cough. ? A hoarse voice.  Your symptoms do not improve with treatment. Get help right away if you:  Have pain in your arms, neck, jaw, teeth, or back.  Feel sweaty, dizzy, or light-headed.  Have chest pain or shortness of breath.  Vomit and your vomit looks like blood or coffee grounds.  Faint.  Have stool that is bloody or black.  Cannot swallow, drink, or eat. Summary  Gastroesophageal reflux happens when acid from the stomach flows up into the esophagus. GERD  is a disease in which the reflux happens often, causes frequent or severe symptoms, or causes problems such as damage to the esophagus.  Treatment for this condition may vary depending on how severe your symptoms are. Your health care provider may recommend diet and lifestyle changes, medicine, or surgery.  Contact a health care provider if you have new or worsening symptoms.  Take over-the-counter and prescription medicines only as told by your health care provider. Do not take aspirin, ibuprofen, or other NSAIDs unless your health care provider told you to do so.  Keep all follow-up visits as told by your health care provider. This is important. This information is not intended to replace advice given to you by your health  care provider. Make sure you discuss any questions you have with your health care provider. Document Revised: 08/30/2017 Document Reviewed: 08/30/2017 Elsevier Patient Education  2020 [employer].

## 2019-05-18 ENCOUNTER — Other Ambulatory Visit: Payer: Self-pay

## 2019-05-18 ENCOUNTER — Ambulatory Visit: Payer: Medicaid Other | Attending: Internal Medicine

## 2019-05-18 DIAGNOSIS — Z23 Encounter for immunization: Secondary | ICD-10-CM

## 2019-05-18 NOTE — Progress Notes (Signed)
   Covid-19 Vaccination Clinic  Name:  Victoria Lester    MRN: [id number] DOB: 10-Mar-1990  05/18/2019  [name] was observed post Covid-19 immunization for 15 minutes without incident. She was provided with Vaccine Information Sheet and instruction to access the V-Safe system.   [name] was instructed to call 911 with any severe reactions post vaccine: Marland Kitchen Difficulty breathing  . Swelling of face and throat  . A fast heartbeat  . A bad rash all over body  . Dizziness and weakness   Immunizations Administered    Name Date Dose VIS Date Route   Pfizer COVID-19 Vaccine 05/18/2019 12:45 PM 0.3 mL 02/15/2019 Intramuscular   Manufacturer: [employer], [employer]   Lot: [id number]   NDC: [address]-8

## 2019-05-21 ENCOUNTER — Other Ambulatory Visit: Payer: Self-pay

## 2019-05-21 ENCOUNTER — Other Ambulatory Visit: Payer: Self-pay | Admitting: Gastroenterology

## 2019-05-21 ENCOUNTER — Ambulatory Visit (INDEPENDENT_AMBULATORY_CARE_PROVIDER_SITE_OTHER): Payer: PRIVATE HEALTH INSURANCE | Admitting: Gastroenterology

## 2019-05-21 ENCOUNTER — Ambulatory Visit: Payer: Self-pay | Admitting: Gastroenterology

## 2019-05-21 VITALS — BP 126/88 | HR 79 | Temp 98.5°F | Ht 65.0 in | Wt 139.0 lb

## 2019-05-21 DIAGNOSIS — K219 Gastro-esophageal reflux disease without esophagitis: Secondary | ICD-10-CM | POA: Diagnosis not present

## 2019-05-21 DIAGNOSIS — R0789 Other chest pain: Secondary | ICD-10-CM | POA: Diagnosis not present

## 2019-05-21 DIAGNOSIS — R1310 Dysphagia, unspecified: Secondary | ICD-10-CM

## 2019-05-21 DIAGNOSIS — F1990 Other psychoactive substance use, unspecified, uncomplicated: Secondary | ICD-10-CM | POA: Diagnosis not present

## 2019-05-21 DIAGNOSIS — R1319 Other dysphagia: Secondary | ICD-10-CM | POA: Insufficient documentation

## 2019-05-21 MED ORDER — OMEPRAZOLE 40 MG PO CPDR: 40.0000 mg | DELAYED_RELEASE_CAPSULE | Freq: Two times a day (BID) | ORAL | 1 refills | Status: DC

## 2019-05-21 NOTE — Patient Instructions (Addendum)
If you are age 29 or older, your body mass index should be between 23-30. Your Body mass index is 23.13 kg/m. If this is out of the aforementioned range listed, please consider follow up with your Primary Care Provider.  If you are age 62 or younger, your body mass index should be between 19-25. Your Body mass index is 23.13 kg/m. If this is out of the aformentioned range listed, please consider follow up with your Primary Care Provider.   We have sent the following medications to your pharmacy for you to pick up at your convenience: Increase Omeprazole to 40 mg twice daily.   Stop all NSAIDs.  Follow up in 4-6 weeks.

## 2019-05-21 NOTE — Progress Notes (Signed)
05/21/2019 Victoria Lester [id number] 1990-06-06   HISTORY OF PRESENT ILLNESS: This is a 29 year old female who is new to our office.  She has been referred here by her PCP, Dr. Nolon Rod, for evaluation regarding reflux type symptoms, chest pain, and dysphagia associated with eating.  She says that about 1.5 months ago she started having burning her chest.  She has also been experiencing dysphagia to solid foods only.  She says that the chest pain and dysphagia only occurs when she eats solid food.  No problems with consuming liquids.  Her PCP started her on omeprazole 20 mg daily and she feels like that this has helped very mildly.  She denies any abdominal pain.  She has never had any symptoms similar to this in the past.  She does admit that she has been using NSAIDs quite frequently because she has been having a lot of migraines.  Says that she eats pretty healthily, consumes mostly water as her beverage of choice.    Past Medical History:  Diagnosis Date  . Allergies   . Anxiety   . GERD (gastroesophageal reflux disease)   . Iron deficiency anemia    Past Surgical History:  Procedure Laterality Date  . WISDOM TOOTH EXTRACTION      reports that she has never smoked. She has never used smokeless tobacco. She reports current alcohol use. She reports that she does not use drugs. family history is not on file. No Known Allergies    Outpatient Encounter Medications as of 05/21/2019  Medication Sig  . aspirin-acetaminophen-caffeine (EXCEDRIN MIGRAINE) 250-250-65 MG tablet Take 2 tablets by mouth every 6 (six) hours as needed for headache or migraine.  . fluticasone (FLONASE) 50 MCG/ACT nasal spray Place 2 sprays into both nostrils daily.  Marland Kitchen levonorgestrel (MIRENA) 20 MCG/24HR IUD 1 each by Intrauterine route once.  Marland Kitchen omeprazole (PRILOSEC) 20 MG capsule Take 1 capsule (20 mg total) by mouth daily.  . sertraline (ZOLOFT) 50 MG tablet Take 50 mg by mouth daily.  . [DISCONTINUED]  dicyclomine (BENTYL) 20 MG tablet Take 1 tablet (20 mg total) by mouth 2 (two) times daily.  . [DISCONTINUED] ibuprofen (ADVIL,MOTRIN) 400 MG tablet Take 1 tablet (400 mg total) by mouth every 8 (eight) hours as needed.  . [DISCONTINUED] loratadine (CLARITIN) 10 MG tablet Take 10 mg by mouth daily.  . [DISCONTINUED] ondansetron (ZOFRAN ODT) 8 MG disintegrating tablet Take 1 tablet (8 mg total) by mouth every 8 (eight) hours as needed for nausea or vomiting.   No facility-administered encounter medications on file as of 05/21/2019.     REVIEW OF SYSTEMS  : All other systems reviewed and negative except where noted in the History of Present Illness.   PHYSICAL EXAM: BP 126/88   Pulse 79   Temp 98.5 F (36.9 C)   Ht 5\' 5"  (1.651 m)   Wt 139 lb (63 kg)   LMP 05/16/2019 (Exact Date)   SpO2 99%   BMI 23.13 kg/m  General: Well developed AA female in no acute distress Head: Normocephalic and atraumatic Eyes:  Sclerae anicteric, conjunctiva pink. Ears: Normal auditory acuity Lungs: Clear throughout to auscultation; no increased WOB. Heart: Regular rate and rhythm; no M/R/G. Abdomen: Soft, non-distended.  BS present.  Non-tender. Musculoskeletal: Symmetrical with no gross deformities  Skin: No lesions on visible extremities Extremities: No edema  Neurological: Alert oriented x 4, grossly non-focal Psychological:  Alert and cooperative. Normal mood and affect  ASSESSMENT AND PLAN: *GERD/atypical chest pain/dysphagia for  the past 1.5 months: Dysphagia to solid food only and chest pain is associated with this as well.  Also in the setting of NSAID use.  I think this is likely all esophagitis induced.  She has only been on omeprazole 20 mg daily for short time and feels like it has helped very mildly.  I am going to increase her PPI to omeprazole 40 mg twice daily.  New prescription sent.  I will see her back in 4 to 6 weeks to assess for improvement.  We discussed endoscopy and she would like  to hold off and try medication for now, but is aware that we will likely proceed at her follow-up visit if no improvement.  Also advised her to discontinue NSAID use and use Tylenol for her migraines for now.  If having onging frequent migraines then may need to discuss other options with her PCP.  Long-term would plan to try to decrease PPI dosing once symptoms under control.   CC:  Doristine Bosworth, MD

## 2019-06-11 ENCOUNTER — Telehealth: Payer: Self-pay | Admitting: Gastroenterology

## 2019-06-11 NOTE — Telephone Encounter (Signed)
The pt states she has continued reflux type symptoms on omeprazole 40 mg BID. She says she always feels like something is stuck in her throat.  She has no problems swallowing or breathing but the feeling is irritating. Her appt was moved up from 4/20 to 4/14 to follow up with Shanda Bumps in the office.  She will call if symptoms worsen

## 2019-06-12 ENCOUNTER — Ambulatory Visit: Payer: PRIVATE HEALTH INSURANCE | Attending: Internal Medicine

## 2019-06-12 DIAGNOSIS — Z23 Encounter for immunization: Secondary | ICD-10-CM

## 2019-06-12 NOTE — Progress Notes (Signed)
   Covid-19 Vaccination Clinic  Name:  Victoria Lester    MRN: [id number] DOB: Apr 26, 1990  06/12/2019  Victoria Lester was observed post Covid-19 immunization for 15 minutes without incident. She was provided with Vaccine Information Sheet and instruction to access the V-Safe system.   Victoria Lester was instructed to call 911 with any severe reactions post vaccine: Marland Kitchen Difficulty breathing  . Swelling of face and throat  . A fast heartbeat  . A bad rash all over body  . Dizziness and weakness   Immunizations Administered    Name Date Dose VIS Date Route   Pfizer COVID-19 Vaccine 06/12/2019  2:51 PM 0.3 mL 02/15/2019 Intramuscular   Manufacturer: [employer], [employer]   Lot: [id number]   NDC: [address]-7

## 2019-06-13 ENCOUNTER — Telehealth: Payer: Self-pay | Admitting: Family Medicine

## 2019-06-13 NOTE — Telephone Encounter (Signed)
Pt called back does not want to make follow up at this point

## 2019-06-19 ENCOUNTER — Encounter: Payer: Self-pay | Admitting: Gastroenterology

## 2019-06-19 ENCOUNTER — Ambulatory Visit (INDEPENDENT_AMBULATORY_CARE_PROVIDER_SITE_OTHER): Payer: PRIVATE HEALTH INSURANCE | Admitting: Gastroenterology

## 2019-06-19 VITALS — BP 122/84 | HR 88 | Temp 98.3°F | Ht 65.0 in | Wt 138.5 lb

## 2019-06-19 DIAGNOSIS — R0989 Other specified symptoms and signs involving the circulatory and respiratory systems: Secondary | ICD-10-CM | POA: Diagnosis not present

## 2019-06-19 DIAGNOSIS — K219 Gastro-esophageal reflux disease without esophagitis: Secondary | ICD-10-CM | POA: Diagnosis not present

## 2019-06-19 DIAGNOSIS — R1310 Dysphagia, unspecified: Secondary | ICD-10-CM

## 2019-06-19 DIAGNOSIS — R1319 Other dysphagia: Secondary | ICD-10-CM

## 2019-06-19 NOTE — Patient Instructions (Signed)
If you are age 29 or older, your body mass index should be between 23-30. Your Body mass index is 23.05 kg/m. If this is out of the aforementioned range listed, please consider follow up with your Primary Care Provider.  If you are age 61 or younger, your body mass index should be between 19-25. Your Body mass index is 23.05 kg/m. If this is out of the aformentioned range listed, please consider follow up with your Primary Care Provider.   You have been scheduled for an endoscopy. Please follow written instructions given to you at your visit today. If you use inhalers (even only as needed), please bring them with you on the day of your procedure.

## 2019-06-19 NOTE — Progress Notes (Signed)
     06/19/2019 Victoria Lester [id number] 11-10-1990   HISTORY OF PRESENT ILLNESS: This is a pleasant 29 year old female who was seen by me last month on May 21, 2019.  Please see that note for further details regarding her symptoms.  She had been on omeprazole 20 mg daily for complaints of acid reflux related symptoms.  When I saw her in March we discussed endoscopy, but she declined at that time.  We decided to increase her PPI to 40 mg twice daily and see her back to get an update on how she is doing.  She says that after couple of weeks she seemed to be doing much better, but then last week she started to have symptoms again.  She says that the burning is better for the most part, but she still gets sensation that things are stuck in her throat/esophagus.  Notices reflux when she is working out at [employer]  Past Medical History:  Diagnosis Date  . Allergies   . Anxiety   . GERD (gastroesophageal reflux disease)   . Iron deficiency anemia    Past Surgical History:  Procedure Laterality Date  . WISDOM TOOTH EXTRACTION      reports that she has never smoked. She has never used smokeless tobacco. She reports current alcohol use. She reports that she does not use drugs. family history is not on file. No Known Allergies    Outpatient Encounter Medications as of 06/19/2019  Medication Sig  . aspirin-acetaminophen-caffeine (EXCEDRIN MIGRAINE) 250-250-65 MG tablet Take 2 tablets by mouth every 6 (six) hours as needed for headache or migraine.  . fluticasone (FLONASE) 50 MCG/ACT nasal spray Place 2 sprays into both nostrils daily.  Marland Kitchen levonorgestrel (MIRENA) 20 MCG/24HR IUD 1 each by Intrauterine route once.  Marland Kitchen omeprazole (PRILOSEC) 40 MG capsule Take 1 capsule (40 mg total) by mouth in the morning and at bedtime. Pharmacy-d/c rx for 30 day supply  . sertraline (ZOLOFT) 50 MG tablet Take 50 mg by mouth daily.   No facility-administered encounter medications on file as of 06/19/2019.      REVIEW OF SYSTEMS  : All other systems reviewed and negative except where noted in the History of Present Illness.   PHYSICAL EXAM: BP 122/84   Pulse 88   Temp 98.3 F (36.8 C)   Ht 5\' 5"  (1.651 m)   Wt 138 lb 8 oz (62.8 kg)   BMI 23.05 kg/m  General: Well developed AA female in no acute distress Head: Normocephalic and atraumatic Eyes:  Sclerae anicteric, conjunctiva pink. Ears: Normal auditory acuity Lungs: Clear throughout to auscultation; no increased WOB. Heart: Regular rate and rhythm; no M/R/G. Abdomen: Soft, non-distended.  BS present.  Non-tender. Musculoskeletal: Symmetrical with no gross deformities  Skin: No lesions on visible extremities Extremities: No edema  Neurological: Alert oriented x 4, grossly non-focal Psychological:  Alert and cooperative. Normal mood and affect  ASSESSMENT AND PLAN: *GERD and dysphagia:  Somewhat improved with increased PPI at 40 mg BID, but still with dysphagia and what sounds like a globus sensation.  Patient agreeable to EGD with possible dilation.  Will schedule with Dr. .  The risks, benefits, and alternatives to EGD with dilation were discussed with the patient and she consents to proceed.   Continue PPI at current dose for now.  CC:  Leone Payor, MD

## 2019-06-25 ENCOUNTER — Ambulatory Visit: Payer: Self-pay | Admitting: Gastroenterology

## 2019-07-24 ENCOUNTER — Other Ambulatory Visit (INDEPENDENT_AMBULATORY_CARE_PROVIDER_SITE_OTHER): Payer: PRIVATE HEALTH INSURANCE

## 2019-07-24 ENCOUNTER — Encounter: Payer: Self-pay | Admitting: Internal Medicine

## 2019-07-24 ENCOUNTER — Other Ambulatory Visit: Payer: Self-pay

## 2019-07-24 ENCOUNTER — Ambulatory Visit (AMBULATORY_SURGERY_CENTER): Payer: PRIVATE HEALTH INSURANCE | Admitting: Internal Medicine

## 2019-07-24 VITALS — BP 129/82 | HR 72 | Temp 97.5°F | Resp 15 | Ht 65.0 in | Wt 138.0 lb

## 2019-07-24 DIAGNOSIS — R079 Chest pain, unspecified: Secondary | ICD-10-CM

## 2019-07-24 DIAGNOSIS — K219 Gastro-esophageal reflux disease without esophagitis: Secondary | ICD-10-CM | POA: Diagnosis present

## 2019-07-24 DIAGNOSIS — R1319 Other dysphagia: Secondary | ICD-10-CM

## 2019-07-24 DIAGNOSIS — R1310 Dysphagia, unspecified: Secondary | ICD-10-CM

## 2019-07-24 DIAGNOSIS — D509 Iron deficiency anemia, unspecified: Secondary | ICD-10-CM

## 2019-07-24 LAB — CBC
HCT: 31.7 % — ABNORMAL LOW (ref 36.0–46.0)
Hemoglobin: 10.4 g/dL — ABNORMAL LOW (ref 12.0–15.0)
MCHC: 32.9 g/dL (ref 30.0–36.0)
MCV: 84.2 fl (ref 78.0–100.0)
Platelets: 318 10*3/uL (ref 150.0–400.0)
RBC: 3.77 Mil/uL — ABNORMAL LOW (ref 3.87–5.11)
RDW: 13.8 % (ref 11.5–15.5)
WBC: 8.9 10*3/uL (ref 4.0–10.5)

## 2019-07-24 LAB — COMPREHENSIVE METABOLIC PANEL
ALT: 7 U/L (ref 0–35)
AST: 17 U/L (ref 0–37)
Albumin: 4.4 g/dL (ref 3.5–5.2)
Alkaline Phosphatase: 87 U/L (ref 39–117)
BUN: 14 mg/dL (ref 6–23)
CO2: 23 mEq/L (ref 19–32)
Calcium: 9 mg/dL (ref 8.4–10.5)
Chloride: 106 mEq/L (ref 96–112)
Creatinine, Ser: 0.77 mg/dL (ref 0.40–1.20)
GFR: 107.65 mL/min (ref >60.00)
Glucose, Bld: 91 mg/dL (ref 70–99)
Potassium: 4.3 mEq/L (ref 3.5–5.1)
Sodium: 136 mEq/L (ref 135–145)
Total Bilirubin: 0.3 mg/dL (ref 0.2–1.2)
Total Protein: 7.6 g/dL (ref 6.0–8.3)

## 2019-07-24 LAB — TSH: TSH: 1.83 u[IU]/mL (ref 0.35–4.50)

## 2019-07-24 LAB — FERRITIN: Ferritin: 9.2 ng/mL — ABNORMAL LOW (ref 10.0–291.0)

## 2019-07-24 MED ORDER — HYOSCYAMINE SULFATE 0.125 MG SL SUBL: 0.1250 mg | SUBLINGUAL_TABLET | SUBLINGUAL | 1 refills | Status: AC | PRN

## 2019-07-24 MED ORDER — SODIUM CHLORIDE 0.9 % IV SOLN: 500.0000 mL | Freq: Once | INTRAVENOUS | Status: AC

## 2019-07-24 MED ORDER — OMEPRAZOLE 40 MG PO CPDR: 40.0000 mg | DELAYED_RELEASE_CAPSULE | Freq: Every day | ORAL | 0 refills | Status: DC

## 2019-07-24 NOTE — Progress Notes (Signed)
JB - temp DT - VS

## 2019-07-24 NOTE — Progress Notes (Signed)
A/ox3, pleased with MAC, report to RN 

## 2019-07-24 NOTE — Op Note (Signed)
[facility] Patient Name: Victoria Lester Procedure Date: 07/24/2019 9:54 AM MRN: [id number] Endoscopist: Gatha Mayer , MD Age: 29 Referring MD:  Date of Birth: Jan 16, 1991 Gender: Female Account #: [id number] Procedure:                Upper GI endoscopy Indications:              Dysphagia, Unexplained chest pain Medicines:                Propofol per Anesthesia, Monitored Anesthesia Care Procedure:                Pre-Anesthesia Assessment:                           - Prior to the procedure, a History and Physical                            was performed, and patient medications and                            allergies were reviewed. The patient's tolerance of                            previous anesthesia was also reviewed. The risks                            and benefits of the procedure and the sedation                            options and risks were discussed with the patient.                            All questions were answered, and informed consent                            was obtained. Prior Anticoagulants: The patient has                            taken no previous anticoagulant or antiplatelet                            agents. ASA Grade Assessment: II - A patient with                            mild systemic disease. After reviewing the risks                            and benefits, the patient was deemed in                            satisfactory condition to undergo the procedure.                           After obtaining informed consent, the endoscope was  passed under direct vision. Throughout the                            procedure, the patient's blood pressure, pulse, and                            oxygen saturations were monitored continuously. The                            Endoscope was introduced through the mouth, and                            advanced to the second part of duodenum. The upper     GI endoscopy was accomplished without difficulty.                            The patient tolerated the procedure well. Scope In: Scope Out: Findings:                 No endoscopic abnormality was evident in the                            esophagus to explain the patient's complaint of                            dysphagia. It was decided, however, to proceed with                            dilation of the entire esophagus. The scope was                            withdrawn. Dilation was performed with a Maloney                            dilator with no resistance at 54 Fr. The dilation                            site was examined following endoscope reinsertion                            and showed no change. Estimated blood loss: none.                           The exam was otherwise without abnormality.                           The cardia and gastric fundus were normal on                            retroflexion. Complications:            No immediate complications. Estimated Blood Loss:     Estimated blood loss: none. Impression:               - No endoscopic esophageal abnormality to explain  patient's dysphagia. Esophagus dilated. Dilated.                           - The examination was otherwise normal.                           - No specimens collected. Recommendation:           - Patient has a contact number available for                            emergencies. The signs and symptoms of potential                            delayed complications were discussed with the                            patient. Return to normal activities tomorrow.                            Written discharge instructions were provided to the                            patient.                           - Resume previous diet.                           - Continue present medications.                           - Reduce omeprazole to 40 mg qd - new Rx sent                            CNC, CMET, TSH and ferritin today                           I will call with results and plans                           Trial of hyosciamine 0.125 mg SL prn                            dysphgaia/chest pain if returns post dilation                           could need ba swallow vs namometry or both                           no thryomegaly on exam Iva Boop, MD 07/24/2019 10:23:05 AM This report has been signed electronically.

## 2019-07-24 NOTE — Patient Instructions (Addendum)
I did not see any problems. Due to the swallowing trouble I dilated the esophagus to see if that helps. Sometimes esophageal muscle spasms, if present (require different testing) can respond to dilation.  I am reducing the omeprazole to 40 mg daily in AM, not twice a day.  I am not certain if problems are acid reflux - other possibilities are spasm of the esophagus which may occur due to stress, and there are other causes not entirely clear. I want to be clear I do not see anything bad going on.  I am also prescribing hyoscyamine to try when you get the pain and checking some labs today. Will let you know results and plans soon.  I appreciate the opportunity to care for you. Gatha Mayer, MD, FACG YOU HAD AN ENDOSCOPIC PROCEDURE TODAY AT [facility]:   Refer to the procedure report that was given to you for any specific questions about what was found during the examination.  If the procedure report does not answer your questions, please call your gastroenterologist to clarify.  If you requested that your care partner not be given the details of your procedure findings, then the procedure report has been included in a sealed envelope for you to review at your convenience later.  YOU SHOULD EXPECT: Some feelings of bloating in the abdomen. Passage of more gas than usual.  Walking can help get rid of the air that was put into your GI tract during the procedure and reduce the bloating. If you had a lower endoscopy (such as a colonoscopy or flexible sigmoidoscopy) you may notice spotting of blood in your stool or on the toilet paper. If you underwent a bowel prep for your procedure, you may not have a normal bowel movement for a few days.  Please Note:  You might notice some irritation and congestion in your nose or some drainage.  This is from the oxygen used during your procedure.  There is no need for concern and it should clear up in a day or so.  SYMPTOMS TO REPORT  IMMEDIATELY:    Following upper endoscopy (EGD)  Vomiting of blood or coffee ground material  New chest pain or pain under the shoulder blades  Painful or persistently difficult swallowing  New shortness of breath  Fever of 100F or higher  Black, tarry-looking stools  For urgent or emergent issues, a gastroenterologist can be reached at any hour by calling [phone]. Do not use MyChart messaging for urgent concerns.    DIET:  We do recommend a small meal at first, but then you may proceed to your regular diet.  Drink plenty of fluids but you should avoid alcoholic beverages for 24 hours.  MEDICATIONS: Continue present medications. Reduce Omeprazole to 40 mg by mouth daily. Dr. Carlean Purl has sent a new prescription to your pharmacy.  Please see handouts given to you by your recovery nurse today.  LABS: You will be brought down to our lab prior to discharge to have blood drawn for CNC, CMET, TSH and ferritin today.  FOLLOW UP: Dr. Carlean Purl will call you with your results and plans moving forward.  ACTIVITY:  You should plan to take it easy for the rest of today and you should NOT DRIVE or use heavy machinery until tomorrow (because of the sedation medicines used during the test).    FOLLOW UP: Our staff will call the number listed on your records 48-72 hours following your procedure to check on you and  address any questions or concerns that you may have regarding the information given to you following your procedure. If we do not reach you, we will leave a message.  We will attempt to reach you two times.  During this call, we will ask if you have developed any symptoms of COVID 19. If you develop any symptoms (ie: fever, flu-like symptoms, shortness of breath, cough etc.) before then, please call [phone].  If you test positive for Covid 19 in the 2 weeks post procedure, please call and report this information to [place].    If any biopsies were taken you will be contacted by phone  or by letter within the next 1-3 weeks.  Please call us at [phone] if you have not heard about the biopsies in 3 weeks.   Thank you for allowing [place] to provide for your healthcare needs today.   SIGNATURES/CONFIDENTIALITY: You and/or your care partner have signed paperwork which will be entered into your electronic medical record.  These signatures attest to the fact that that the information above on your After Visit Summary has been reviewed and is understood.  Full responsibility of the confidentiality of this discharge information lies with you and/or your care-partner.

## 2019-07-24 NOTE — Progress Notes (Signed)
Called to room to assist during endoscopic procedure.  Patient ID and intended procedure confirmed with present staff. Received instructions for my participation in the procedure from the performing physician.  

## 2019-07-26 ENCOUNTER — Telehealth: Payer: Self-pay | Admitting: *Deleted

## 2019-07-26 NOTE — Telephone Encounter (Signed)
  Follow up Call-  Call back number 07/24/2019  Post procedure Call Back phone  # #[phone] cell  Permission to leave phone message Yes  Some recent data might be hidden     Patient questions:  Do you have a fever, pain , or abdominal swelling? No. Pain Score  0 *  Have you tolerated food without any problems? Yes.    Have you been able to return to your normal activities? Yes.    Do you have any questions about your discharge instructions: Diet   No. Medications  No. Follow up visit  No.  Do you have questions or concerns about your Care? No.  Actions: * If pain score is 4 or above: No action needed, pain <4.  1. Have you developed a fever since your procedure? no  2.   Have you had an respiratory symptoms (SOB or cough) since your procedure? no  3.   Have you tested positive for COVID 19 since your procedure no  4.   Have you had any family members/close contacts diagnosed with the COVID 19 since your procedure?  no   If yes to any of these questions please route to Laverna Peace, RN and Charlett Lango, RN

## 2019-08-19 ENCOUNTER — Other Ambulatory Visit: Payer: Self-pay | Admitting: Gastroenterology

## 2019-09-15 ENCOUNTER — Ambulatory Visit: Payer: Self-pay

## 2020-06-29 ENCOUNTER — Encounter: Payer: Medicaid Other | Admitting: Nurse Practitioner
